# Patient Record
Sex: Male | Born: 1980 | Race: White | Hispanic: No | Marital: Single
Health system: Southern US, Community
[De-identification: ages and names within clinical notes are randomized; demographics above are authoritative.]

---

## 2007-01-07 ENCOUNTER — Emergency Department (HOSPITAL_COMMUNITY): Admission: EM | Admit: 2007-01-07 | Discharge: 2007-01-07 | Payer: Self-pay | Admitting: Emergency Medicine

## 2008-01-31 IMAGING — CR DG HAND COMPLETE 3+V*L*
3 series · 3 of 3 positions shown · non-contrast
Comparison: none

CLINICAL DATA: Patient punched door.  Pain.  
 LEFT HAND ? 3 VIEW:

[x hand pa left]
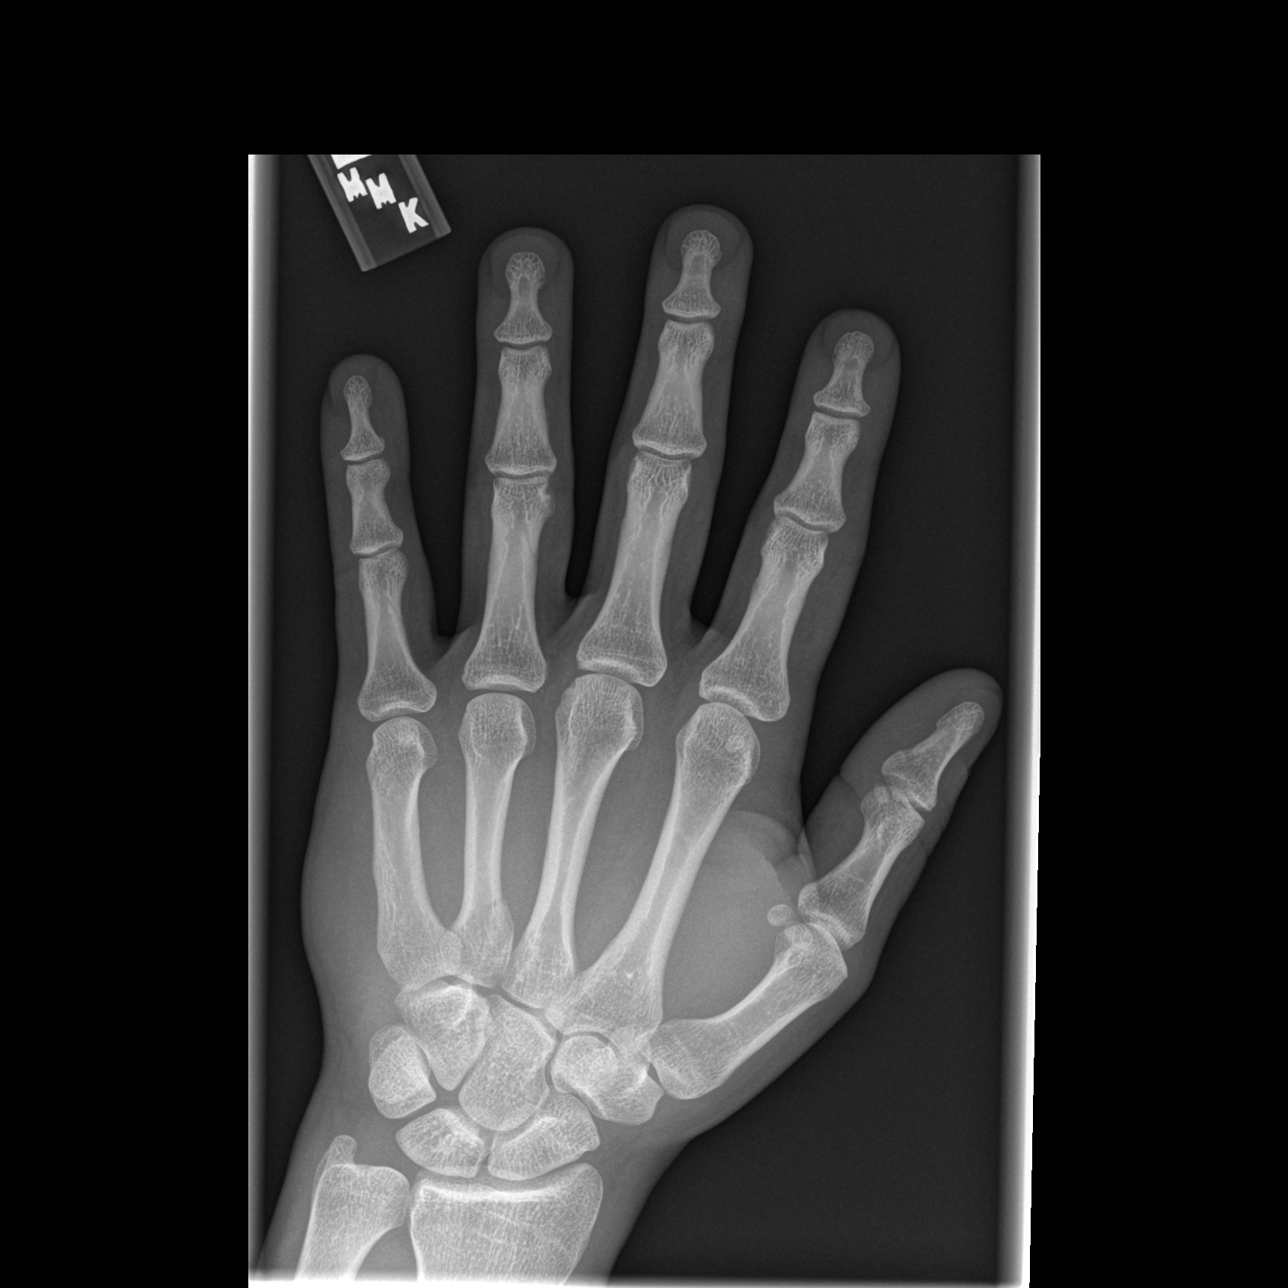

[x hand oblique left]
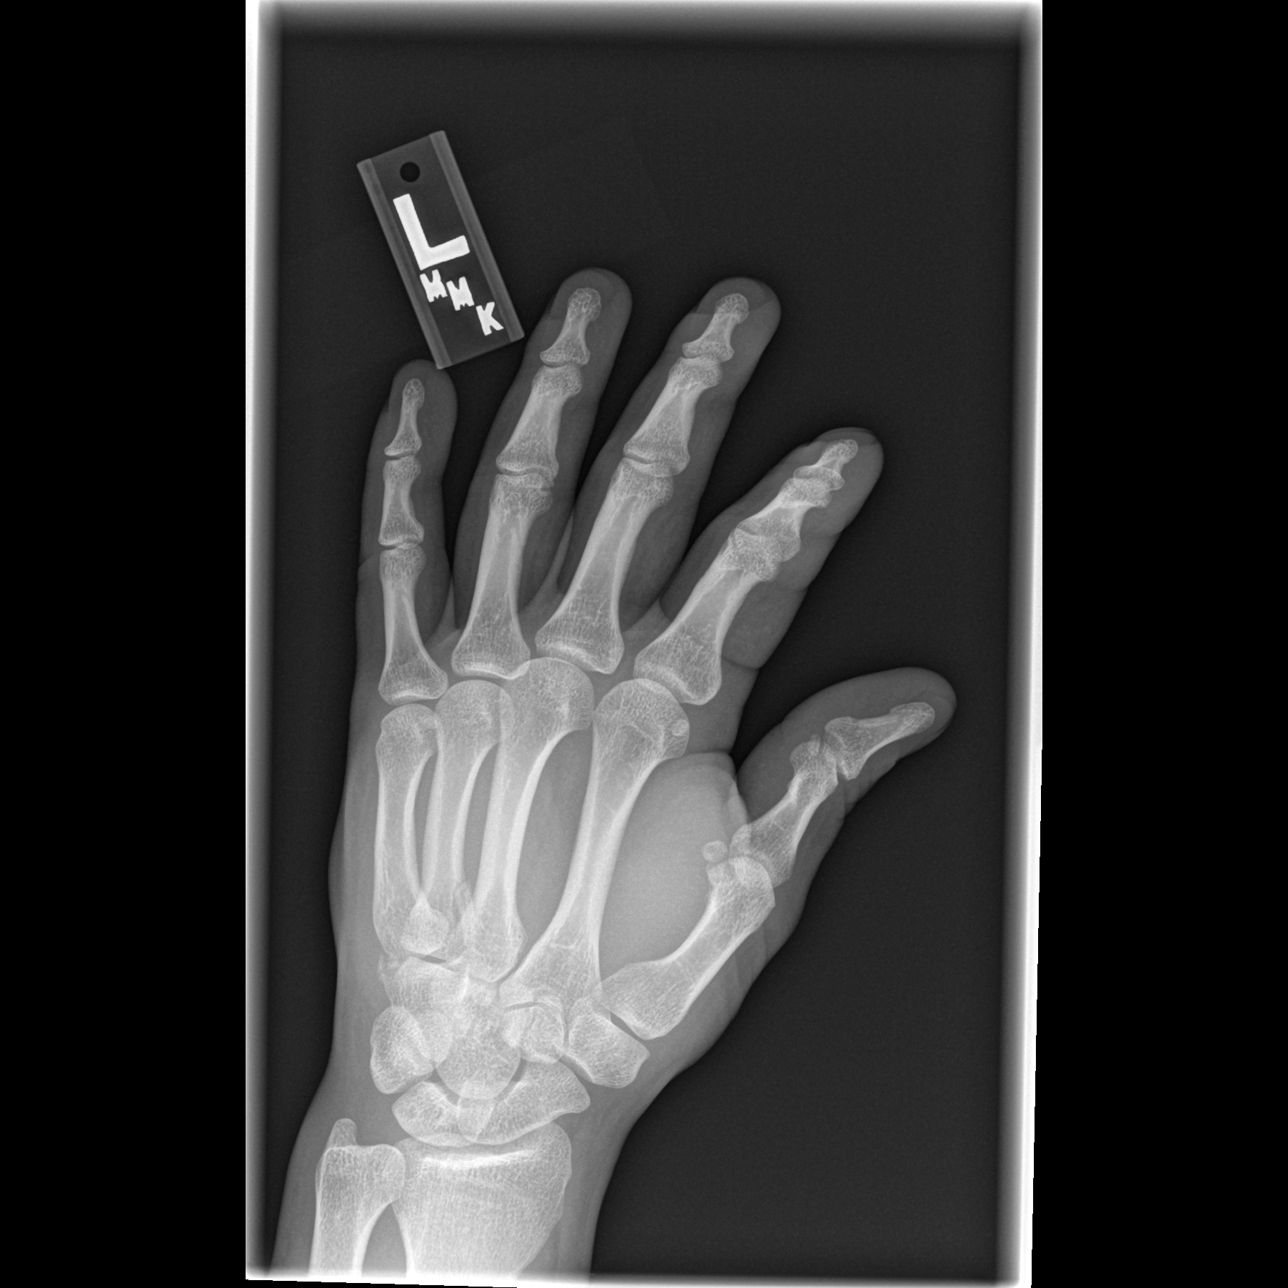

[x hand lat left]
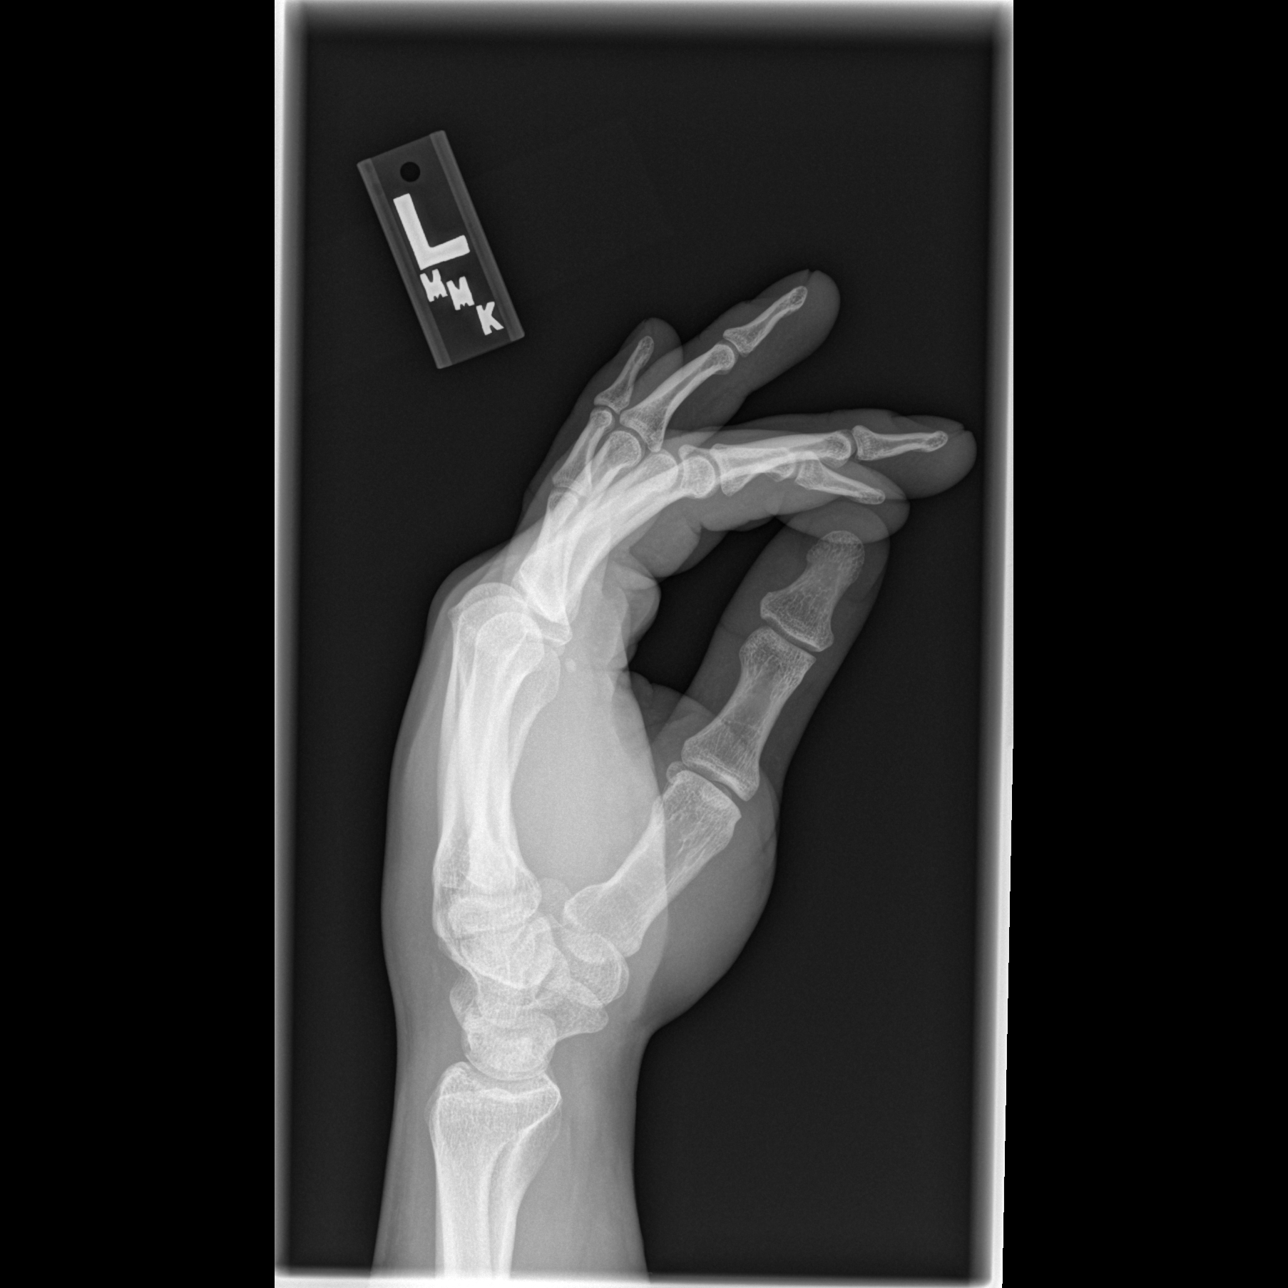

[3 of 3 positions shown; findings below may reference images not displayed]

FINDINGS: There is a nondisplaced fracture at the base of the fourth metacarpal, which may reach the fourth CMC joint.  No other acute fractures identified.  There is some deformity of the fifth metacarpal possibly from old fracture.  Ulnar aspect of the aspect is swollen.
IMPRESSION: Nondisplaced fracture base of fourth metacarpal.

## 2019-12-31 ENCOUNTER — Telehealth: Payer: Self-pay | Admitting: *Deleted

## 2021-07-06 NOTE — Telephone Encounter (Signed)
done
